# Patient Record
Sex: Male | Born: 1975 | Race: Asian | Hispanic: No | Marital: Single | Smoking: Never smoker
Health system: Southern US, Community
[De-identification: ages and names within clinical notes are randomized; demographics above are authoritative.]

## PROBLEM LIST (undated history)

## (undated) DIAGNOSIS — F419 Anxiety disorder, unspecified: Secondary | ICD-10-CM

## (undated) HISTORY — DX: Anxiety disorder, unspecified: F41.9

---

## 2013-04-16 ENCOUNTER — Emergency Department (HOSPITAL_COMMUNITY)
Admission: EM | Admit: 2013-04-16 | Discharge: 2013-04-16 | Disposition: A | Discharge status: Home / Self Care | Payer: Medicaid Other | Source: Home / Self Care

## 2013-04-16 ENCOUNTER — Encounter (HOSPITAL_COMMUNITY): Payer: Self-pay | Admitting: Emergency Medicine

## 2013-04-16 DIAGNOSIS — K645 Perianal venous thrombosis: Secondary | ICD-10-CM

## 2013-04-16 DIAGNOSIS — K2970 Gastritis, unspecified, without bleeding: Secondary | ICD-10-CM

## 2013-04-16 DIAGNOSIS — K5909 Other constipation: Secondary | ICD-10-CM

## 2013-04-16 MED ORDER — OMEPRAZOLE 20 MG PO CPDR: 20.0000 mg | DELAYED_RELEASE_CAPSULE | Freq: Every day | ORAL | Status: DC

## 2013-04-16 MED ORDER — TRIPLE ANTIBIOTIC 5-400-5000 EX OINT: TOPICAL_OINTMENT | Freq: Four times a day (QID) | CUTANEOUS | Status: DC

## 2013-04-16 MED ORDER — DOCUSATE SODIUM 100 MG PO CAPS: 100.0000 mg | ORAL_CAPSULE | Freq: Two times a day (BID) | ORAL | Status: DC

## 2013-04-16 MED ORDER — POLYETHYLENE GLYCOL 3350 17 G PO PACK: 17.0000 g | PACK | Freq: Every day | ORAL | Status: DC

## 2013-04-16 NOTE — ED Notes (Signed)
Via [place] interpreter... Pt c/o external hemorrhoids onset 5 days [place] a month ago... Since then, his been constipated Sxs include: pain Denies: blood.... He is alert and oriented w/no signs of acute distress.

## 2013-04-16 NOTE — ED Provider Notes (Signed)
History     CSN: [id number]  Arrival date & time 04/16/13  1337   None     Chief Complaint  Patient presents with  . Hemorrhoids    (Consider location/radiation/quality/duration/timing/severity/associated sxs/prior treatment) HPI This is a 37 year old male who presents with a complaint of hemorrhoids.Patient has recently come to the [place] from [place] and does not speak any English and therefore her translator is being used. He states that he is usually constipated and now is having pain down in the anal area. He has taken herbal medication and [place] for constipation but it never resolves completely. He's not having any bleeding. He does also complain of some abdominal pain pointing to the epigastric right upper quadrant area. He states that it usually occurs before he eats and resolves after he's done he didn't. He describes it as a "ache" rather than a cramping or burning. He does not complain of any nausea or vomiting associated with it. History reviewed. No pertinent past medical history.  History reviewed. No pertinent past surgical history.  No family history on file.  History  Substance Use Topics  . Smoking status: Never Smoker   . Smokeless tobacco: Not on file  . Alcohol Use: No      Review of Systems  Constitutional: Negative.   HENT: Negative.   Eyes: Negative.   Respiratory: Negative.   Cardiovascular: Negative.   Gastrointestinal: Positive for abdominal pain, constipation and rectal pain. Negative for blood in stool.       Per history of present illness  Genitourinary: Negative.   Musculoskeletal: Negative.   Skin: Negative.   Neurological: Negative.   Hematological: Negative.   Psychiatric/Behavioral: Negative.     Allergies  Review of patient's allergies indicates no known allergies.  Home Medications   Current Outpatient Rx  Name  Route  Sig  Dispense  Refill  . docusate sodium (COLACE) 100 MG capsule   Oral   Take 1 capsule (100 mg total) by  mouth 2 (two) times daily.   10 capsule   0   . neomycin-bacitracin-polymyxin (NEOSPORIN) [phone] ointment   Topical   Apply topically 4 (four) times daily.   28.3 g   0   . omeprazole (PRILOSEC) 20 MG capsule   Oral   Take 1 capsule (20 mg total) by mouth daily.   30 capsule   0   . polyethylene glycol (MIRALAX) packet   Oral   Take 17 g by mouth daily.   14 each   0     Can take up to 3 times a day     BP 132/85  Pulse 71  Temp(Src) 97.9 F (36.6 C) (Oral)  Resp 16  SpO2 99%  Physical Exam  ED Course  Procedures (including critical care time)  Labs Reviewed - No data to display No results found.   1. External hemorrhoid, thrombosed   2. Constipation, chronic   3. Gastritis       MDM  External hemorrhoid excised by Dr Lorenz Coaster.  Followinf medications for constipation ordered: Colace 100 BID and Miralax PRN TID.  The following for treatment of the incised area: Triple antibiotic ointment and sitz baths.  The follow for gastritis: Prilosec OTC 20 mg daily along with strict avoidence of spicy food and alcohol.          Calvert Cantor, MD 04/16/13 1454

## 2014-05-19 ENCOUNTER — Ambulatory Visit (INDEPENDENT_AMBULATORY_CARE_PROVIDER_SITE_OTHER): Payer: BC Managed Care – PPO | Admitting: Family Medicine

## 2014-05-19 ENCOUNTER — Ambulatory Visit (INDEPENDENT_AMBULATORY_CARE_PROVIDER_SITE_OTHER): Payer: BC Managed Care – PPO

## 2014-05-19 VITALS — BP 118/84 | HR 65 | Temp 97.8°F | Resp 16 | Ht 65.0 in | Wt 150.4 lb

## 2014-05-19 DIAGNOSIS — M542 Cervicalgia: Secondary | ICD-10-CM

## 2014-05-19 DIAGNOSIS — R42 Dizziness and giddiness: Secondary | ICD-10-CM

## 2014-05-19 DIAGNOSIS — R51 Headache: Secondary | ICD-10-CM

## 2014-05-19 LAB — CBC WITH DIFFERENTIAL/PLATELET
Basophils Absolute: 0.1 10*3/uL (ref 0.0–0.1)
Basophils Relative: 1 % (ref 0–1)
Eosinophils Absolute: 0.4 10*3/uL (ref 0.0–0.7)
Eosinophils Relative: 5 % (ref 0–5)
HCT: 44.4 % (ref 39.0–52.0)
Hemoglobin: 15.6 g/dL (ref 13.0–17.0)
Lymphocytes Relative: 30 % (ref 12–46)
Lymphs Abs: 2.2 10*3/uL (ref 0.7–4.0)
MCH: 25.7 pg — ABNORMAL LOW (ref 26.0–34.0)
MCHC: 35.1 g/dL (ref 30.0–36.0)
MCV: 73.3 fL — ABNORMAL LOW (ref 78.0–100.0)
Monocytes Absolute: 0.7 10*3/uL (ref 0.1–1.0)
Monocytes Relative: 9 % (ref 3–12)
Neutro Abs: 4.1 10*3/uL (ref 1.7–7.7)
Neutrophils Relative %: 55 % (ref 43–77)
Platelets: 226 10*3/uL (ref 150–400)
RBC: 6.06 MIL/uL — ABNORMAL HIGH (ref 4.22–5.81)
RDW: 13 % (ref 11.5–15.5)
WBC: 7.4 10*3/uL (ref 4.0–10.5)

## 2014-05-19 LAB — COMPLETE METABOLIC PANEL WITH GFR
Albumin: 4.3 g/dL (ref 3.5–5.2)
Alkaline Phosphatase: 50 U/L (ref 39–117)
BUN: 10 mg/dL (ref 6–23)
Creat: 0.69 mg/dL (ref 0.50–1.35)
GFR, Est Non African American: >89 mL/min
Glucose, Bld: 94 mg/dL (ref 70–99)
Potassium: 3.8 mEq/L (ref 3.5–5.3)

## 2014-05-19 LAB — COMPLETE METABOLIC PANEL WITHOUT GFR
ALT: 45 U/L (ref 0–53)
AST: 28 U/L (ref 0–37)
CO2: 25 meq/L (ref 19–32)
Calcium: 9.2 mg/dL (ref 8.4–10.5)
Chloride: 101 meq/L (ref 96–112)
GFR, Est African American: >89 mL/min
Sodium: 141 meq/L (ref 135–145)
Total Bilirubin: 0.7 mg/dL (ref 0.2–1.2)
Total Protein: 7 g/dL (ref 6.0–8.3)

## 2014-05-19 LAB — POCT UA - MICROSCOPIC ONLY
Casts, Ur, LPF, POC: NEGATIVE
Crystals, Ur, HPF, POC: NEGATIVE
Mucus, UA: POSITIVE
Yeast, UA: NEGATIVE

## 2014-05-19 LAB — POCT URINALYSIS DIPSTICK
Bilirubin, UA: NEGATIVE
Blood, UA: NEGATIVE
Glucose, UA: NEGATIVE
Ketones, UA: NEGATIVE
Leukocytes, UA: NEGATIVE
Nitrite, UA: NEGATIVE
Protein, UA: NEGATIVE
Spec Grav, UA: 1.02
Urobilinogen, UA: 0.2
pH, UA: 6

## 2014-05-19 LAB — GLUCOSE, POCT (MANUAL RESULT ENTRY): POC Glucose: 94 mg/dl (ref 70–99)

## 2014-05-19 MED ORDER — CYCLOBENZAPRINE HCL 5 MG PO TABS: 5.0000 mg | ORAL_TABLET | Freq: Every evening | ORAL | Status: AC | PRN

## 2014-05-19 MED ORDER — CYCLOBENZAPRINE HCL 5 MG PO TABS: 5.0000 mg | ORAL_TABLET | Freq: Every evening | ORAL | Status: DC | PRN

## 2014-05-19 MED ORDER — IBUPROFEN 600 MG PO TABS: 600.0000 mg | ORAL_TABLET | Freq: Three times a day (TID) | ORAL | Status: DC | PRN

## 2014-05-19 MED ORDER — IBUPROFEN 600 MG PO TABS: 600.0000 mg | ORAL_TABLET | Freq: Three times a day (TID) | ORAL | Status: AC | PRN

## 2014-05-19 NOTE — Progress Notes (Signed)
 Chief Complaint:  Chief Complaint  Patient presents with  . Dizziness    x2 weeks  . Neck Pain    x2 weeks    HPI: Nicholas Flowers is a 38 y.o. male who is here for   2 week history of occipital headache, muscle soreness in his neck  , denies any CP, SOB, vision changes, Nausea, vomiting or abd pain, or prior neck injuries  Denies any numbness, weakness or tingling or rashes or fevers or chills Feels like he is going to faint x 2 episodes at work. . No CP or SOB or palpitations at those times, he does not have DM and is  He feels faint at work x 2 episodes He is usually sitting down when this occurs, so he is going from standing to sitting He is eating and drinking regular, feels he has been hydrating himself  He has no urinary sxs.  Denies history of HA in the past.  He works in a [employer]  And there is a lot of repetitive motion  Past Medical History  Diagnosis Date  . Anxiety    History reviewed. No pertinent past surgical history. History   Social History  . Marital Status: Single    Spouse Name: N/A    Number of Children: N/A  . Years of Education: N/A   Social History Main Topics  . Smoking status: Never Smoker   . Smokeless tobacco: None  . Alcohol Use: No  . Drug Use: No  . Sexual Activity: None   Other Topics Concern  . None   Social History Narrative  . None   No family history on file. No Known Allergies Prior to Admission medications   Medication Sig Start Date End Date Taking? Authorizing Provider  Ascorbic Acid (VITAMIN C) 100 MG tablet Take 100 mg by mouth daily.   Yes Historical Provider, MD     ROS: The patient denies fevers, chills, night sweats, unintentional weight loss, chest pain, palpitations, wheezing, dyspnea on exertion, nausea, vomiting, abdominal pain, dysuria, hematuria, melena, numbness, weakness, or tingling.   All other systems have been reviewed and were otherwise negative with the exception of those mentioned in the  HPI and as above.    PHYSICAL EXAM: Filed Vitals:   05/19/14 0924  BP: 118/84  Pulse: 65  Temp: 97.8 F (36.6 C)  Resp: 16   Filed Vitals:   05/19/14 0924  Height: 5\' 5"  (1.651 m)  Weight: 150 lb 6.4 oz (68.221 kg)   Body mass index is 25.03 kg/(m^2).  General: Alert, no acute distress HEENT:  Normocephalic, atraumatic, oropharynx patent. EOMI, PERRLA, fundo exam is normal, TM normal Cardiovascular:  Regular rate and rhythm, no rubs murmurs or gallops.  No Carotid bruits, radial pulse intact. No pedal edema.  Respiratory: Clear to auscultation bilaterally.  No wheezes, rales, or rhonchi.  No cyanosis, no use of accessory musculature GI: No organomegaly, abdomen is soft and non-tender, positive bowel sounds.  No masses. Skin: No rashes. Neurologic: Facial musculature symmetric. Cn 2-12 grossly normal Psychiatric: Patient is appropriate throughout our interaction. Lymphatic: No cervical lymphadenopathy Musculoskeletal: Gait intact.   LABS: Results for orders placed in visit on 05/19/14  POCT URINALYSIS DIPSTICK      Result Value Ref Range   Color, UA yellow     Clarity, UA clear     Glucose, UA neg     Bilirubin, UA neg     Ketones, UA neg  Spec Grav, UA 1.020     Blood, UA neg     pH, UA 6.0     Protein, UA neg     Urobilinogen, UA 0.2     Nitrite, UA neg     Leukocytes, UA Negative    POCT UA - MICROSCOPIC ONLY      Result Value Ref Range   WBC, Ur, HPF, POC 3-7     RBC, urine, microscopic 3-9     Bacteria, U Microscopic 2+     Mucus, UA pos     Epithelial cells, urine per micros 0-1     Crystals, Ur, HPF, POC neg     Casts, Ur, LPF, POC neg     Yeast, UA neg    GLUCOSE, POCT (MANUAL RESULT ENTRY)      Result Value Ref Range   POC Glucose 94  70 - 99 mg/dl     EKG/XRAY:   Primary read interpreted by Dr. Conley Rolls at [facility]. C spine normal   ASSESSMENT/PLAN: Encounter Diagnoses  Name Primary?  . Dizziness and giddiness   . Neck pain Yes  .  Headache(784.0)    38 y/o [place] man who is here for what appears to be positional dizziness, also occipital HA with normal C spine Neurologically intact  Orthostatics normal, UA normal, labs pending He  Was given flexeril and ibuprofen today for msk related HA  Advise to push fluids and to move slowly  F/u prn  Gross sideeffects, risk and benefits, and alternatives of medications d/w patient. Patient is aware that all medications have potential sideeffects and we are unable to predict every sideeffect or drug-drug interaction that may occur.  ,  PHUONG, DO 05/19/2014 11:32 AM

## 2014-05-19 NOTE — Patient Instructions (Signed)

## 2014-05-25 ENCOUNTER — Encounter: Payer: Self-pay | Admitting: Family Medicine

## 2014-05-25 ENCOUNTER — Telehealth: Payer: Self-pay | Admitting: Family Medicine

## 2014-05-25 NOTE — Telephone Encounter (Signed)
Unable to leave a message, wrong person? Will send letter.

## 2015-03-10 IMAGING — CR DG CERVICAL SPINE 2 OR 3 VIEWS
2 series · 2 of 2 positions shown · non-contrast
Comparison: None.

CLINICAL DATA: Headaches

EXAM:
CERVICAL SPINE  4+ VIEWS

[lateral]
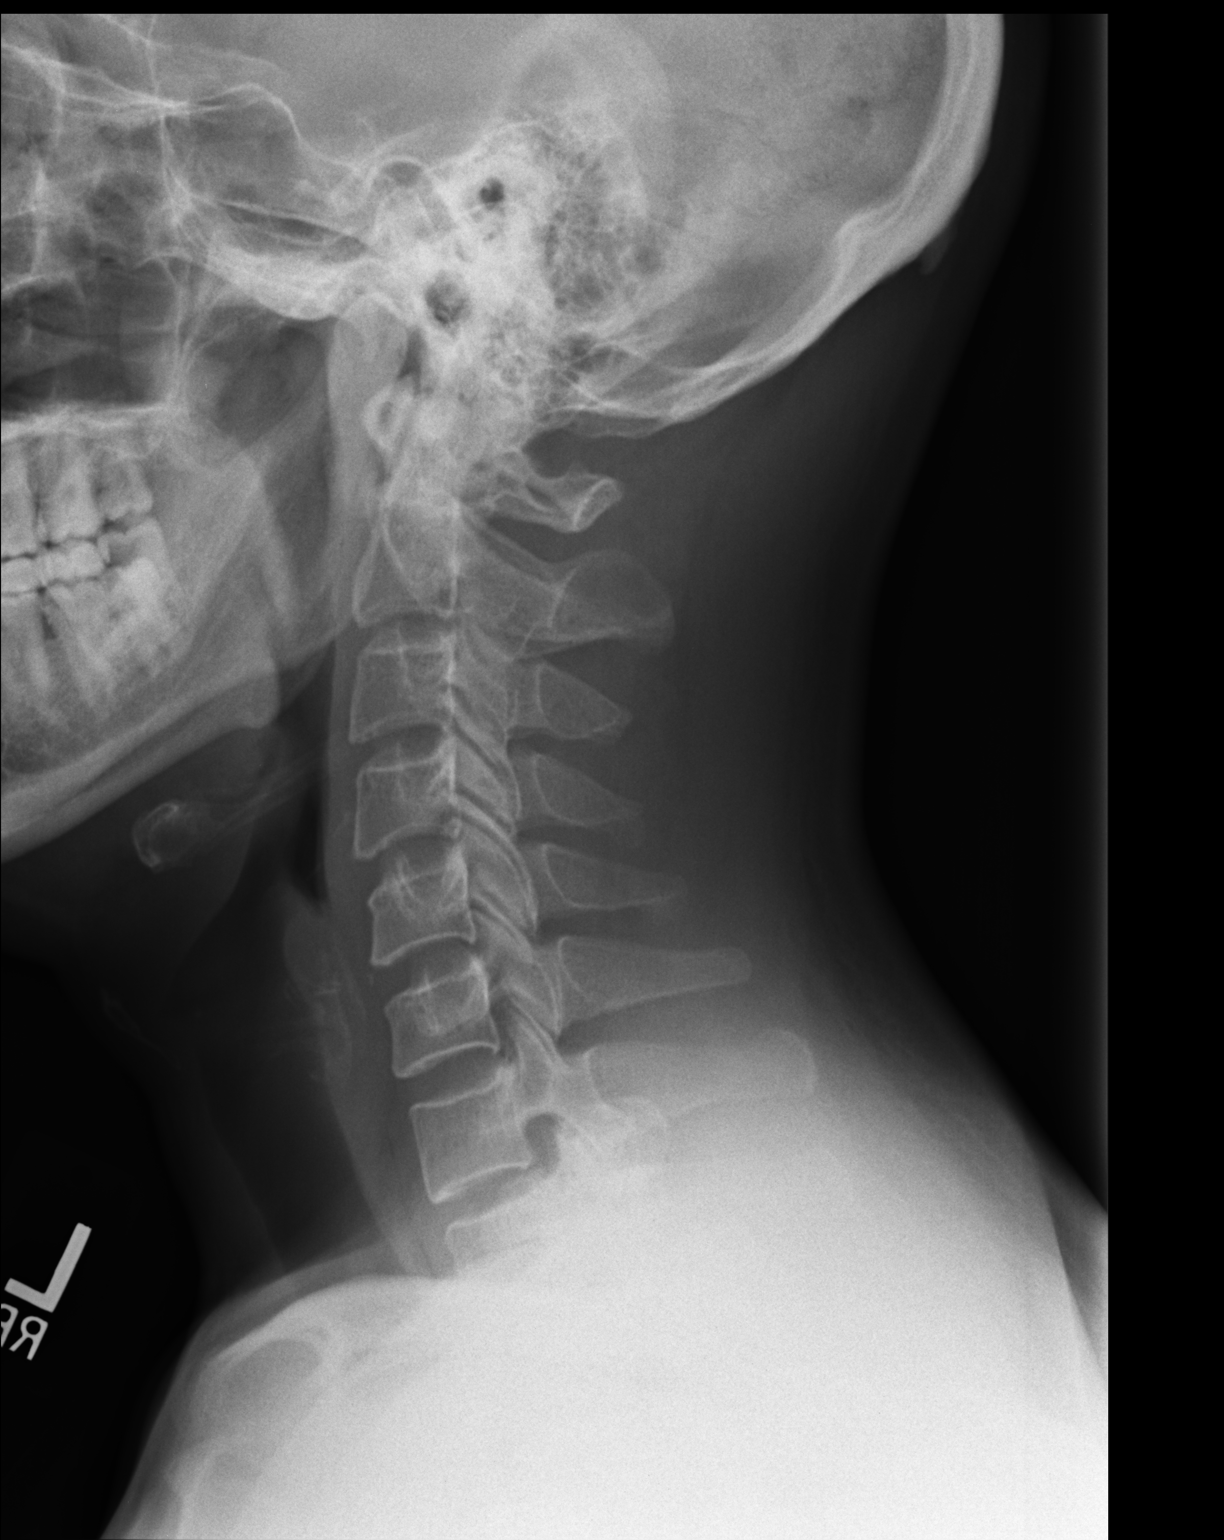

[AP]
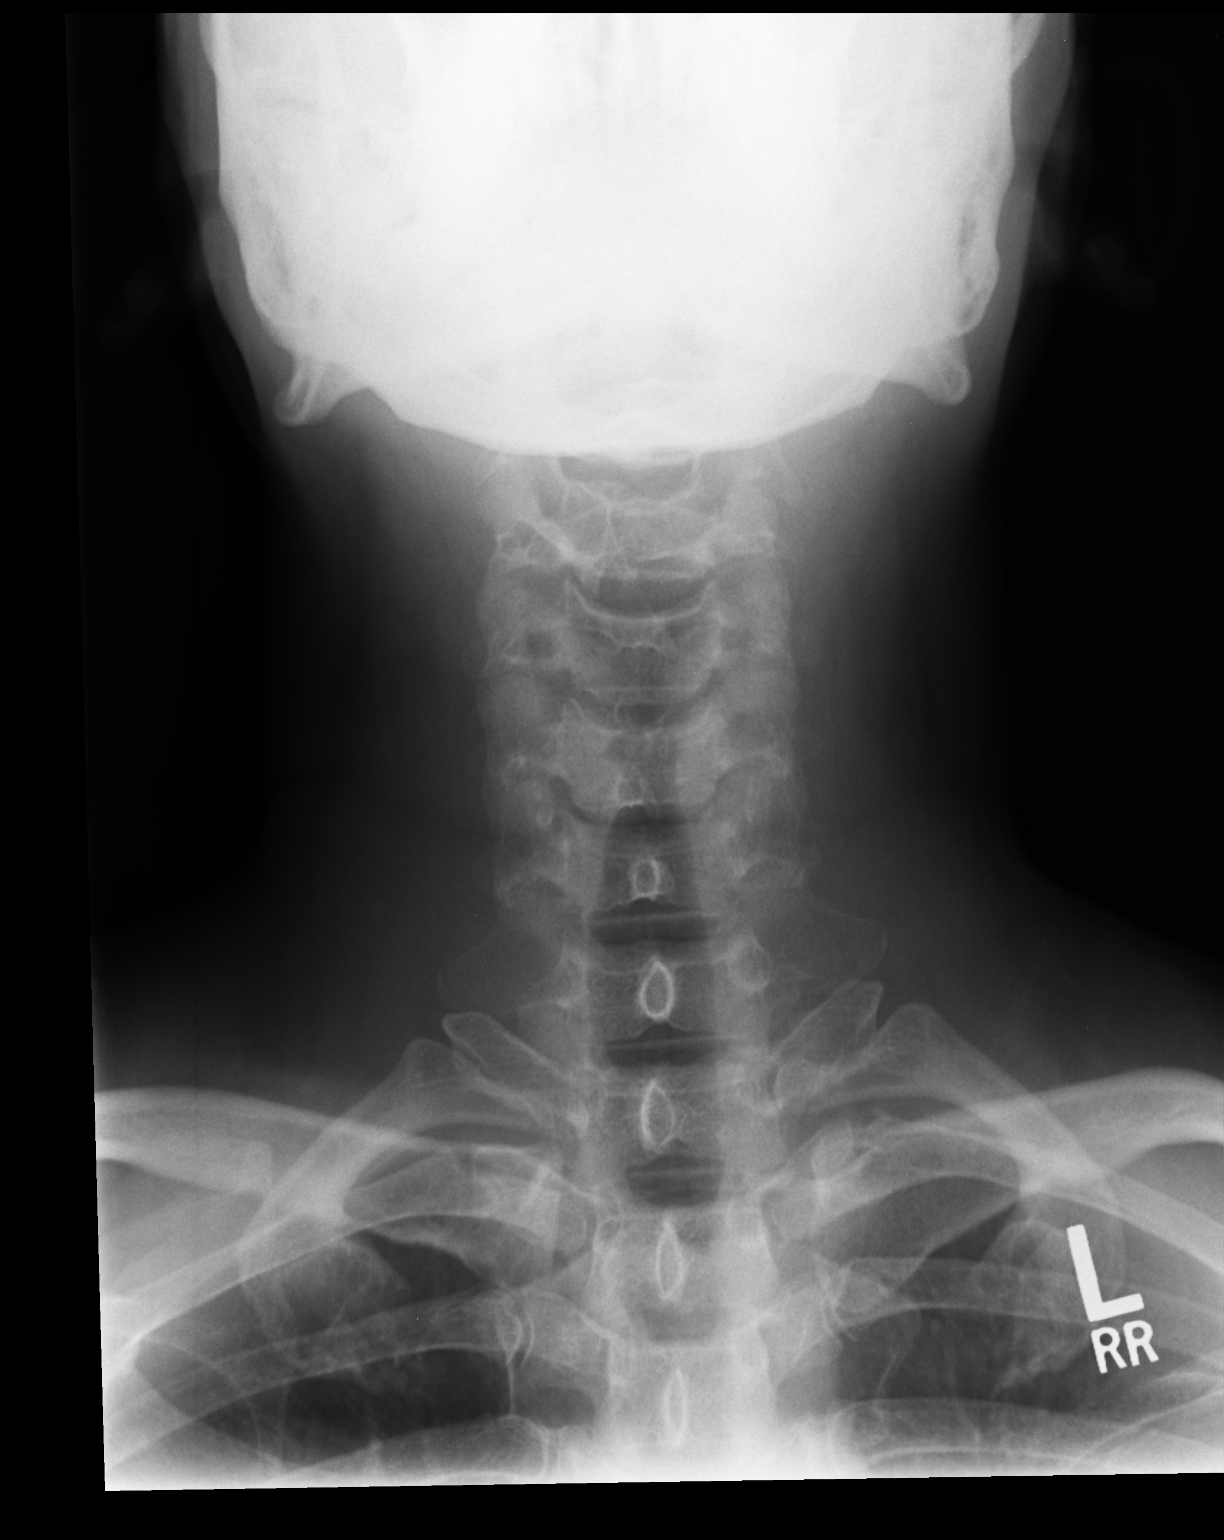

[2 of 2 positions shown; findings below may reference images not displayed]

FINDINGS: There is no evidence of cervical spine fracture or prevertebral soft
tissue swelling. Alignment is normal. No other significant bone
abnormalities are identified.
IMPRESSION: Negative cervical spine radiographs.
